# Patient Record
Sex: Female | Born: 1960 | Race: White | Hispanic: No | Marital: Married | Smoking: Former smoker
Health system: Southern US, Community
[De-identification: ages and names within clinical notes are randomized; demographics above are authoritative.]

## PROBLEM LIST (undated history)

## (undated) DIAGNOSIS — E785 Hyperlipidemia, unspecified: Secondary | ICD-10-CM

## (undated) DIAGNOSIS — I10 Essential (primary) hypertension: Secondary | ICD-10-CM

## (undated) DIAGNOSIS — E119 Type 2 diabetes mellitus without complications: Secondary | ICD-10-CM

## (undated) HISTORY — PX: ABDOMINAL HYSTERECTOMY: SHX81

---

## 2004-05-27 ENCOUNTER — Other Ambulatory Visit: Payer: Self-pay

## 2004-05-28 ENCOUNTER — Inpatient Hospital Stay: Payer: Self-pay | Admitting: Internal Medicine

## 2004-06-11 ENCOUNTER — Emergency Department: Payer: Self-pay | Admitting: Unknown Physician Specialty

## 2004-06-11 ENCOUNTER — Other Ambulatory Visit: Payer: Self-pay

## 2004-07-30 ENCOUNTER — Emergency Department: Payer: Self-pay | Admitting: Emergency Medicine

## 2004-07-30 ENCOUNTER — Other Ambulatory Visit: Payer: Self-pay

## 2005-01-29 ENCOUNTER — Emergency Department: Payer: Self-pay | Admitting: Emergency Medicine

## 2005-01-29 ENCOUNTER — Other Ambulatory Visit: Payer: Self-pay

## 2005-06-13 ENCOUNTER — Emergency Department: Payer: Self-pay | Admitting: Emergency Medicine

## 2006-06-18 ENCOUNTER — Emergency Department: Payer: Self-pay | Admitting: Emergency Medicine

## 2007-09-20 ENCOUNTER — Emergency Department: Payer: Self-pay | Admitting: Emergency Medicine

## 2014-02-09 ENCOUNTER — Ambulatory Visit: Payer: Self-pay | Admitting: Internal Medicine

## 2014-04-19 ENCOUNTER — Ambulatory Visit: Payer: Self-pay | Admitting: Internal Medicine

## 2015-08-12 ENCOUNTER — Encounter: Payer: Self-pay | Admitting: *Deleted

## 2015-08-12 ENCOUNTER — Ambulatory Visit
Admission: RE | Admit: 2015-08-12 | Discharge: 2015-08-12 | Disposition: A | Discharge status: Home / Self Care | Payer: Self-pay | Source: Ambulatory Visit | Attending: Internal Medicine | Admitting: Internal Medicine

## 2015-08-12 ENCOUNTER — Encounter (INDEPENDENT_AMBULATORY_CARE_PROVIDER_SITE_OTHER): Payer: Self-pay

## 2015-08-12 ENCOUNTER — Ambulatory Visit: Payer: Self-pay | Attending: Internal Medicine | Admitting: *Deleted

## 2015-08-12 VITALS — BP 142/86 | HR 62 | Temp 98.3°F | Ht 65.75 in | Wt 202.5 lb

## 2015-08-12 DIAGNOSIS — N6452 Nipple discharge: Secondary | ICD-10-CM

## 2015-08-12 NOTE — Patient Instructions (Signed)
Gave patient hand-out, Women Staying Healthy, Active and Well from [facility], with education on breast health, pap smears, heart and colon health. 

## 2015-08-12 NOTE — Progress Notes (Signed)
Subjective:     Patient ID: Tierah Forgacs, female   DOB: 04-Mar-1960, 55 y.o.   MRN: [id number]  HPI   Review of Systems     Objective:   Physical Exam  Pulmonary/Chest: Right breast exhibits no inverted nipple, no mass, no nipple discharge, no skin change and no tenderness. Left breast exhibits no inverted nipple, no mass, no nipple discharge, no skin change and no tenderness. Breasts are symmetrical.       Assessment:     55 year old White female referred to [facility] by Dr. Maryruth Hancock for 10 year history of bilateral intermittent nipple discharge.  Patient states she has been able to express a dark green discharge from bilateral nipples with a significant amount of manipulation.  States there has never been any spontaneous discharge.  States she was told by a physician to stop expressing the discharge.  On clinical breast exam there is no dominant mass, skin changes, nipple discharge or lymphadenopathy.  Encouraged patient to also stop trying to elicit the discharge.  Taught self breast awareness.  Patient has been screened for eligibility.  She does not have any insurance, Medicare or Medicaid.  She also meets financial eligibility.  Hand-out given on the Affordable Care Act.    Plan:     Will get bilateral diagnostic mammogram and ultrasound.  Will follow up per [facility] protocol.

## 2015-08-20 ENCOUNTER — Encounter: Payer: Self-pay | Admitting: *Deleted

## 2015-08-20 NOTE — Progress Notes (Signed)
Called and reviewed mammogram results with patient.  Although the discharge has been present for 10 years and thought to be nonpathologic, I offered to refer for surgical consult for further evaluation.  Patient refused at this time.  Encouraged to call if the nipple discharge becomes spontaneous, becomes bloody or clear.  She was also encouraged to stop manipulating the discharge.  She is agreeable.  She is to return in one year for annual exam.

## 2015-08-23 ENCOUNTER — Other Ambulatory Visit: Payer: Self-pay

## 2017-04-27 ENCOUNTER — Ambulatory Visit: Payer: Self-pay | Admitting: Urology

## 2017-04-29 ENCOUNTER — Other Ambulatory Visit: Payer: Self-pay | Admitting: Urology

## 2017-04-29 DIAGNOSIS — R310 Gross hematuria: Secondary | ICD-10-CM

## 2017-04-30 ENCOUNTER — Emergency Department: Payer: Commercial Managed Care - PPO

## 2017-04-30 ENCOUNTER — Other Ambulatory Visit: Payer: Self-pay

## 2017-04-30 ENCOUNTER — Emergency Department
Admission: EM | Admit: 2017-04-30 | Discharge: 2017-04-30 | Disposition: A | Discharge status: Home / Self Care | Payer: Commercial Managed Care - PPO | Attending: Emergency Medicine | Admitting: Emergency Medicine

## 2017-04-30 ENCOUNTER — Encounter: Payer: Self-pay | Admitting: Emergency Medicine

## 2017-04-30 DIAGNOSIS — M6281 Muscle weakness (generalized): Secondary | ICD-10-CM | POA: Insufficient documentation

## 2017-04-30 DIAGNOSIS — M545 Low back pain, unspecified: Secondary | ICD-10-CM

## 2017-04-30 DIAGNOSIS — Z87891 Personal history of nicotine dependence: Secondary | ICD-10-CM | POA: Insufficient documentation

## 2017-04-30 DIAGNOSIS — E119 Type 2 diabetes mellitus without complications: Secondary | ICD-10-CM | POA: Insufficient documentation

## 2017-04-30 DIAGNOSIS — I10 Essential (primary) hypertension: Secondary | ICD-10-CM | POA: Diagnosis not present

## 2017-04-30 DIAGNOSIS — R1030 Lower abdominal pain, unspecified: Secondary | ICD-10-CM | POA: Diagnosis not present

## 2017-04-30 DIAGNOSIS — R531 Weakness: Secondary | ICD-10-CM

## 2017-04-30 HISTORY — DX: Type 2 diabetes mellitus without complications: E11.9

## 2017-04-30 HISTORY — DX: Essential (primary) hypertension: I10

## 2017-04-30 HISTORY — DX: Hyperlipidemia, unspecified: E78.5

## 2017-04-30 LAB — URINALYSIS, COMPLETE (UACMP) WITH MICROSCOPIC
Bilirubin Urine: NEGATIVE
Glucose, UA: NEGATIVE mg/dL
Hgb urine dipstick: NEGATIVE
Ketones, ur: NEGATIVE mg/dL
Leukocytes, UA: NEGATIVE
Nitrite: NEGATIVE
Protein, ur: NEGATIVE mg/dL
Specific Gravity, Urine: 1.014 (ref 1.005–1.030)
pH: 5 (ref 5.0–8.0)

## 2017-04-30 LAB — BASIC METABOLIC PANEL
ANION GAP: 11 (ref 5–15)
BUN: 17 mg/dL (ref 6–20)
CHLORIDE: 99 mmol/L — AB (ref 101–111)
CO2: 27 mmol/L (ref 22–32)
CREATININE: 0.68 mg/dL (ref 0.44–1.00)
Calcium: 9.2 mg/dL (ref 8.9–10.3)
GFR calc non Af Amer: >60 mL/min (ref >60)
Glucose, Bld: 166 mg/dL — ABNORMAL HIGH (ref 65–99)
POTASSIUM: 3.8 mmol/L (ref 3.5–5.1)
SODIUM: 137 mmol/L (ref 135–145)

## 2017-04-30 LAB — CBC
HCT: 41.5 % (ref 35.0–47.0)
Hemoglobin: 14.5 g/dL (ref 12.0–16.0)
MCH: 32.8 pg (ref 26.0–34.0)
MCHC: 34.9 g/dL (ref 32.0–36.0)
MCV: 94 fL (ref 80.0–100.0)
Platelets: 237 10*3/uL (ref 150–440)
RBC: 4.42 MIL/uL (ref 3.80–5.20)
RDW: 12.5 % (ref 11.5–14.5)
WBC: 7.9 10*3/uL (ref 3.6–11.0)

## 2017-04-30 LAB — TSH: TSH: 1.129 u[IU]/mL (ref 0.350–4.500)

## 2017-04-30 NOTE — ED Triage Notes (Signed)
Pt to ED via POV c/o back pain. Pt states that she was tested for UTI and it was negative. Pt states that she is scheduled for a CT on Tuesday. Pt states that this morning she noticed that she is feeling lethargic and weak. Pt states that she "feels like something is wrong". Pt is in NAD.

## 2017-04-30 NOTE — ED Provider Notes (Signed)
[facility] Emergency Department Provider Note ____________________________________________   First MD Initiated Contact with Patient 04/30/17 1420     (approximate)  I have reviewed the triage vital signs and the nursing notes.   HISTORY  Chief Complaint Back Pain and Weakness    HPI Vilma PraderJanice Bellomo is a 57 y.o. female with PMH as noted below who presents with back pain for the last few weeks, gradual onset, bilateral, mainly in the lower back.  She also reports generalized weakness and feels like she falls asleep easily, which has been particularly in the last few days.  The patient states initially a few weeks ago she developed hematuria, followed up with a urologist few days ago, and is scheduled for some outpatient testing.  She is supposed to have a CT of her abdomen in the next several days to look at her kidneys, and likely a cystoscopy next week.   The patient states that the hematuria has resolved.  She denies fever, vomiting, change in bowel movements, or abdominal pain.   Past Medical History:  Diagnosis Date  . Diabetes mellitus without complication (HCC)   . Hyperlipemia   . Hypertension     There are no active problems to display for this patient.   Past Surgical History:  Procedure Laterality Date  . ABDOMINAL HYSTERECTOMY      Prior to Admission medications   Not on File    Allergies Codeine; Contrast media [iodinated diagnostic agents]; Morphine and related; and Septra [sulfamethoxazole-trimethoprim]  Family History  Problem Relation Age of Onset  . Breast cancer Neg Hx     Social History Social History   Tobacco Use  . Smoking status: Former Games developermoker  . Smokeless tobacco: Never Used  Substance Use Topics  . Alcohol use: Yes    Comment: Occ  . Drug use: Not Currently    Review of Systems  Constitutional: No fever/chills Eyes: No visual changes. ENT: No sore throat. Cardiovascular: Denies chest  pain. Respiratory: Denies shortness of breath. Gastrointestinal: No vomiting or diarrhea.  Genitourinary: Negative for dysuria or hematuria.  Musculoskeletal: Positive for back pain. Skin: Negative for rash. Neurological: Negative for headache.   ____________________________________________   PHYSICAL EXAM:  VITAL SIGNS: ED Triage Vitals  Enc Vitals Group     BP 04/30/17 1109 (!) 165/85     Pulse Rate 04/30/17 1109 79     Resp 04/30/17 1109 16     Temp 04/30/17 1109 99 F (37.2 C)     Temp Source 04/30/17 1109 Oral     SpO2 04/30/17 1109 99 %     Weight 04/30/17 1110 210 lb (95.3 kg)     Height 04/30/17 1110 5\' 3"  (1.6 m)     Head Circumference --      Peak Flow --      Pain Score 04/30/17 1117 5     Pain Loc --      Pain Edu? --      Excl. in GC? --     Constitutional: Alert and oriented. Well appearing and in no acute distress. Eyes: Conjunctivae are normal.  EOMI.  PERRLA. Head: Atraumatic. Nose: No congestion/rhinnorhea. Mouth/Throat: Mucous membranes are moist.   Neck: Normal range of motion.  Cardiovascular: Good peripheral circulation. Respiratory: Normal respiratory effort.  Gastrointestinal: Soft and nontender. No distention.  Genitourinary: No CVA tenderness. Musculoskeletal: No lower extremity edema.  Extremities warm and well perfused.  No midline or paraspinal lower back tenderness. Neurologic:  Normal speech  and language. No gross focal neurologic deficits are appreciated.  Skin:  Skin is warm and dry. No rash noted. Psychiatric: Mood and affect are normal. Speech and behavior are normal.  ____________________________________________   LABS (all labs ordered are listed, but only abnormal results are displayed)  Labs Reviewed  BASIC METABOLIC PANEL - Abnormal; Notable for the following components:      Result Value   Chloride 99 (*)    Glucose, Bld 166 (*)    All other components within normal limits  URINALYSIS, COMPLETE (UACMP) WITH  MICROSCOPIC - Abnormal; Notable for the following components:   Color, Urine YELLOW (*)    APPearance CLEAR (*)    Squamous Epithelial / LPF 0-5 (*)    All other components within normal limits  CBC  TSH  CBG MONITORING, ED   ____________________________________________  EKG  ED ECG REPORT I, Dionne Bucy, the attending physician, personally viewed and interpreted this ECG.  Date: 04/30/2017 EKG Time: 1123 Rate: 71 Rhythm: normal sinus rhythm QRS Axis: normal Intervals: normal ST/T Wave abnormalities: normal Narrative Interpretation: no evidence of acute ischemia  ____________________________________________  RADIOLOGY  CT abdomen: pending  ____________________________________________   PROCEDURES  Procedure(s) performed: No  Procedures  Critical Care performed: No ____________________________________________   INITIAL IMPRESSION / ASSESSMENT AND PLAN / ED COURSE  Pertinent labs & imaging results that were available during my care of the patient were reviewed by me and considered in my medical decision making (see chart for details).  57 year old female with past medical history as noted above presents with back pain over the last few weeks, resolved hematuria, and generalized weakness and malaise.  On exam, the patient is well-appearing, vital signs are normal except for hypertension, and the remainder of the exam is as described above.  There is no back or abdominal tenderness on exam.  Overall the differential is somewhat broad.  It is possible that the patient could be having ureteral stones although this is less likely given that the pain is bilateral.  She has no specific symptoms to suggest malignancy.  Her UA is not consistent with UTI, she also had a negative UA at the urologist several days ago.  In terms of generalized weakness, differential includes dehydration, electrolyte abnormality, anemia, infection, or possibly thyroid  dysfunction.  Plan: I will obtain the CT abdomen (without contrast) that had been already ordered as an outpatient but not yet completed, basic labs, TSH, and reassess.  Anticipate discharge home if no acute findings on the work-up.    I am signing out the patient to the oncoming physician Dr. Roxan Hockey pending TSH and CT result.  ____________________________________________   FINAL CLINICAL IMPRESSION(S) / ED DIAGNOSES  Final diagnoses:  Low back pain without sciatica, unspecified back pain laterality, unspecified chronicity  Generalized weakness      NEW MEDICATIONS STARTED DURING THIS VISIT:  New Prescriptions   No medications on file     Note:  This document was prepared using Dragon voice recognition software and may include unintentional dictation errors.    Dionne Bucy, MD 04/30/17 [phone]

## 2017-04-30 NOTE — ED Provider Notes (Signed)
Patient received in sign-out from Dr. Marisa SeverinSiadecki.  Workup and evaluation pending CT and tsh.  He shows evidence of probable renal scarring will require dedicated CT with IV contrast ultrasound with does not appear to require this on an emergent basis.  TSH within normal limits.  Patient stable and appropriate for outpatient follow-up.       Deborah Velasquez, Deborah Born, MD 04/30/17 1556

## 2017-05-04 ENCOUNTER — Ambulatory Visit: Payer: Commercial Managed Care - PPO

## 2017-05-10 ENCOUNTER — Other Ambulatory Visit: Payer: Self-pay | Admitting: Urology

## 2017-05-10 DIAGNOSIS — R310 Gross hematuria: Secondary | ICD-10-CM

## 2017-05-12 ENCOUNTER — Ambulatory Visit
Admission: RE | Admit: 2017-05-12 | Discharge: 2017-05-12 | Disposition: A | Discharge status: Home / Self Care | Payer: Commercial Managed Care - PPO | Source: Ambulatory Visit | Attending: Urology | Admitting: Urology

## 2017-05-12 DIAGNOSIS — R310 Gross hematuria: Secondary | ICD-10-CM | POA: Diagnosis not present

## 2017-05-17 ENCOUNTER — Other Ambulatory Visit: Payer: Self-pay | Admitting: Urology

## 2017-05-17 DIAGNOSIS — N2889 Other specified disorders of kidney and ureter: Secondary | ICD-10-CM

## 2017-05-19 ENCOUNTER — Ambulatory Visit: Payer: Commercial Managed Care - PPO

## 2017-05-21 ENCOUNTER — Ambulatory Visit: Payer: Self-pay

## 2017-05-31 ENCOUNTER — Ambulatory Visit: Payer: Commercial Managed Care - PPO

## 2017-06-10 ENCOUNTER — Encounter (INDEPENDENT_AMBULATORY_CARE_PROVIDER_SITE_OTHER): Payer: Self-pay

## 2017-06-10 ENCOUNTER — Ambulatory Visit
Admission: RE | Admit: 2017-06-10 | Discharge: 2017-06-10 | Disposition: A | Discharge status: Home / Self Care | Payer: Commercial Managed Care - PPO | Source: Ambulatory Visit | Attending: Urology | Admitting: Urology

## 2017-06-10 DIAGNOSIS — N281 Cyst of kidney, acquired: Secondary | ICD-10-CM | POA: Insufficient documentation

## 2017-06-10 DIAGNOSIS — I700 Atherosclerosis of aorta: Secondary | ICD-10-CM | POA: Diagnosis not present

## 2017-06-10 DIAGNOSIS — N2889 Other specified disorders of kidney and ureter: Secondary | ICD-10-CM

## 2017-06-10 DIAGNOSIS — K449 Diaphragmatic hernia without obstruction or gangrene: Secondary | ICD-10-CM | POA: Diagnosis not present

## 2017-06-10 DIAGNOSIS — K760 Fatty (change of) liver, not elsewhere classified: Secondary | ICD-10-CM | POA: Insufficient documentation

## 2017-06-10 DIAGNOSIS — K8689 Other specified diseases of pancreas: Secondary | ICD-10-CM | POA: Insufficient documentation

## 2017-06-10 MED ORDER — GADOBENATE DIMEGLUMINE 529 MG/ML IV SOLN
20.0000 mL | Freq: Once | INTRAVENOUS | Status: AC | PRN
  Administered 2017-06-10: 20 mL via INTRAVENOUS

## 2017-09-06 ENCOUNTER — Other Ambulatory Visit: Payer: Self-pay | Admitting: Family Medicine

## 2017-09-06 DIAGNOSIS — Z1231 Encounter for screening mammogram for malignant neoplasm of breast: Secondary | ICD-10-CM

## 2018-09-22 ENCOUNTER — Other Ambulatory Visit: Payer: Self-pay

## 2018-09-22 DIAGNOSIS — Z20822 Contact with and (suspected) exposure to covid-19: Secondary | ICD-10-CM

## 2018-09-23 ENCOUNTER — Telehealth: Payer: Self-pay

## 2018-09-23 LAB — NOVEL CORONAVIRUS, NAA: SARS-CoV-2, NAA: NOT DETECTED

## 2018-09-23 NOTE — Telephone Encounter (Signed)
This encounter was created in error - please disregard.

## 2018-09-23 NOTE — Telephone Encounter (Signed)
Provided Covid lab testing results.   Voiced understanding.

## 2018-09-29 ENCOUNTER — Other Ambulatory Visit: Payer: Self-pay

## 2018-09-29 DIAGNOSIS — Z20822 Contact with and (suspected) exposure to covid-19: Secondary | ICD-10-CM

## 2018-09-30 LAB — NOVEL CORONAVIRUS, NAA: SARS-CoV-2, NAA: NOT DETECTED

## 2018-10-03 ENCOUNTER — Telehealth: Payer: Self-pay | Admitting: Family Medicine

## 2018-10-03 NOTE — Telephone Encounter (Signed)
Patient is calling to receive her negative COVID test results. Patient expressed understanding. 

## 2018-10-03 NOTE — Telephone Encounter (Signed)
Pt states she asked for her covid results from 9/10 be mailed to her home, but she never received. Pt requesting resutls from 9/10 and 9/17 be faxed to her home. Fax number: [phone]  Pt cannot return to work until she receives something in writing.  Pt has mychart, but only has iphone, no computer to print from.

## 2018-10-20 ENCOUNTER — Other Ambulatory Visit: Payer: Self-pay

## 2018-10-20 DIAGNOSIS — Z20822 Contact with and (suspected) exposure to covid-19: Secondary | ICD-10-CM

## 2018-10-21 LAB — NOVEL CORONAVIRUS, NAA: SARS-CoV-2, NAA: NOT DETECTED

## 2018-10-27 ENCOUNTER — Other Ambulatory Visit: Payer: Self-pay

## 2018-10-27 DIAGNOSIS — Z20822 Contact with and (suspected) exposure to covid-19: Secondary | ICD-10-CM

## 2018-10-29 LAB — NOVEL CORONAVIRUS, NAA: SARS-CoV-2, NAA: NOT DETECTED

## 2019-03-27 ENCOUNTER — Other Ambulatory Visit: Payer: Commercial Managed Care - PPO

## 2019-06-16 ENCOUNTER — Ambulatory Visit: Payer: Self-pay | Admitting: Podiatry

## 2019-07-05 ENCOUNTER — Encounter: Payer: Self-pay | Admitting: Podiatry

## 2019-07-05 ENCOUNTER — Ambulatory Visit (INDEPENDENT_AMBULATORY_CARE_PROVIDER_SITE_OTHER): Payer: Commercial Managed Care - PPO | Admitting: Podiatry

## 2019-07-05 ENCOUNTER — Ambulatory Visit (INDEPENDENT_AMBULATORY_CARE_PROVIDER_SITE_OTHER): Payer: Commercial Managed Care - PPO

## 2019-07-05 ENCOUNTER — Other Ambulatory Visit: Payer: Self-pay

## 2019-07-05 DIAGNOSIS — I10 Essential (primary) hypertension: Secondary | ICD-10-CM | POA: Insufficient documentation

## 2019-07-05 DIAGNOSIS — M722 Plantar fascial fibromatosis: Secondary | ICD-10-CM

## 2019-07-05 DIAGNOSIS — E785 Hyperlipidemia, unspecified: Secondary | ICD-10-CM | POA: Insufficient documentation

## 2019-07-05 DIAGNOSIS — K219 Gastro-esophageal reflux disease without esophagitis: Secondary | ICD-10-CM | POA: Insufficient documentation

## 2019-07-05 DIAGNOSIS — E119 Type 2 diabetes mellitus without complications: Secondary | ICD-10-CM | POA: Insufficient documentation

## 2019-07-05 MED ORDER — METHYLPREDNISOLONE 4 MG PO TBPK: ORAL_TABLET | ORAL | 0 refills | Status: DC

## 2019-07-05 NOTE — Progress Notes (Signed)
Subjective:  Patient ID: Deborah Velasquez, female    DOB: 1960/06/02,  MRN: [id number] HPI Chief Complaint  Patient presents with  . 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 New Patient (Initial Visit)    59 y.o. female presents with the above complaint.   ROS: Denies fever chills nausea vomiting muscle aches pains calf pain back pain chest pain shortness of breath.  Past Medical History:  Diagnosis Date  . Diabetes mellitus without complication ([place])   . Hyperlipemia   . Hypertension    Past Surgical History:  Procedure Laterality Date  . ABDOMINAL HYSTERECTOMY      Current Outpatient Medications:  .  aspirin EC 81 MG tablet, Take 81 mg by mouth daily. Swallow whole., Disp: , Rfl:  .  famotidine (PEPCID) 10 MG tablet, Take 10 mg by mouth 2 (two) times daily., Disp: , Rfl:  .  atorvastatin (LIPITOR) 40 MG tablet, SMARTSIG:1 Tablet(s) By Mouth Every Evening, Disp: , Rfl:  .  glipiZIDE (GLUCOTROL XL) 5 MG 24 hr tablet, Take 5 mg by mouth daily., Disp: , Rfl:  .  lisinopril-hydrochlorothiazide (ZESTORETIC) 20-25 MG tablet, Take 1 tablet by mouth daily., Disp: , Rfl:  .  methylPREDNISolone (MEDROL DOSEPAK) 4 MG TBPK tablet, 6 day dose pack - take as directed, Disp: 21 tablet, Rfl: 0 .  metoprolol succinate (TOPROL-XL) 50 MG 24 hr tablet, Take 50 mg by mouth daily., Disp: , Rfl:   Allergies  Allergen Reactions  . Codeine Itching and Nausea Only  . Contrast Media [Iodinated Diagnostic Agents] Itching and Nausea Only  . Morphine And Related Itching and Nausea Only  . Septra [Sulfamethoxazole-Trimethoprim] Itching and Nausea Only   Review of Systems Objective:  There were no vitals filed for this visit.  General: Well developed, nourished, in no acute distress, alert and oriented x3   Dermatological: Skin is warm, dry and supple bilateral.  Nails x 10 are well maintained; remaining integument appears unremarkable at this time. There are no open sores, no preulcerative lesions, no rash or signs of infection present.  Vascular: Dorsalis Pedis artery and Posterior Tibial artery pedal pulses are 2/4 bilateral with immedate capillary fill time. Pedal hair growth present. No varicosities and no lower extremity edema present bilateral.   Neruologic: Grossly intact via light touch bilateral. Vibratory intact via tuning fork bilateral. Protective threshold with Semmes Wienstein monofilament intact to all pedal sites bilateral. Patellar and Achilles deep tendon reflexes 2+ bilateral. No Babinski or clonus noted bilateral.   Musculoskeletal: No gross boney pedal deformities bilateral. No pain, crepitus, or limitation noted with foot and ankle range of motion bilateral. Muscular strength 5/5 in all groups tested bilateral.  She has pain on palpation medial calcaneal tubercles bilaterally.  Gait: Unassisted, Nonantalgic.    Radiographs:  Radiographs taken today bilateral foot demonstrate soft tissue increase in density plantar past cocaine insertion site consistent with plantar fasciitis no significant spurring is noted.  Assessment & Plan:   Assessment: Plantar fasciitis bilateral.    Plan: Injected the bilateral heels today 20 mg Kenalog 5 mg Marcaine point maximal tenderness.  Tolerated procedure well.  Placement plan pressure brace and a night splint discussed appropriate shoe gear stretching exercises ice therapy and shoe modifications.  Start her on a Medrol Dosepak.  Did not give her any NSAIDs because of  her GI tract.  I will follow up with her in 1 month     Aditri Louischarles T. Sad[place], [place]

## 2019-07-27 DIAGNOSIS — M79676 Pain in unspecified toe(s): Secondary | ICD-10-CM

## 2019-08-09 ENCOUNTER — Other Ambulatory Visit: Payer: Self-pay

## 2019-08-09 ENCOUNTER — Encounter: Payer: Self-pay | Admitting: Podiatry

## 2019-08-09 ENCOUNTER — Ambulatory Visit (INDEPENDENT_AMBULATORY_CARE_PROVIDER_SITE_OTHER): Payer: Commercial Managed Care - PPO | Admitting: Podiatry

## 2019-08-09 DIAGNOSIS — M722 Plantar fascial fibromatosis: Secondary | ICD-10-CM | POA: Diagnosis not present

## 2019-08-09 MED ORDER — METHYLPREDNISOLONE 4 MG PO TBPK: ORAL_TABLET | ORAL | 0 refills | Status: AC

## 2019-08-09 NOTE — Progress Notes (Signed)
She presents today for follow-up of her plantar fasciitis. States that her left foot is doing great and the right foot is still hurting and is even starting to hurt up into the arch and toward the ball of the foot.  Objective: Vital signs are stable she is alert and oriented x3. Pulses are palpable. She still has some tenderness on palpation medial calcaneal tubercle of the right heel none on the left foot.  Assessment: Left foot is 95% improved right foot some improved but still hurting.  Plan: I injected the right heel today 20 mg of Kenalog 5 mg Marcaine point maximal tenderness. Reinitiate a Medrol Dosepak and discussed appropriate shoe gear once again. On follow-up with her in 1 month to see how she is doing we probably will end up having to get her some orthotics made.

## 2019-09-13 ENCOUNTER — Ambulatory Visit: Payer: Commercial Managed Care - PPO | Admitting: Podiatry

## 2019-10-18 ENCOUNTER — Ambulatory Visit: Payer: Commercial Managed Care - PPO | Admitting: Podiatry

## 2019-11-06 ENCOUNTER — Other Ambulatory Visit: Payer: Self-pay | Admitting: Family Medicine

## 2019-11-06 DIAGNOSIS — Z1231 Encounter for screening mammogram for malignant neoplasm of breast: Secondary | ICD-10-CM

## 2020-01-13 IMAGING — US US RENAL
1 series · 14 of 25 positions shown · non-contrast
Comparison: None; reported abnormal CT is not available for
comparison

CLINICAL DATA: Gross hematuria, diabetes mellitus, hypertension,
abnormal CT exam

EXAM:
RENAL / URINARY TRACT ULTRASOUND COMPLETE

[Series 1: us renal · 0.25mm/px · 14 of 65 slices shown]
[im 1/65]
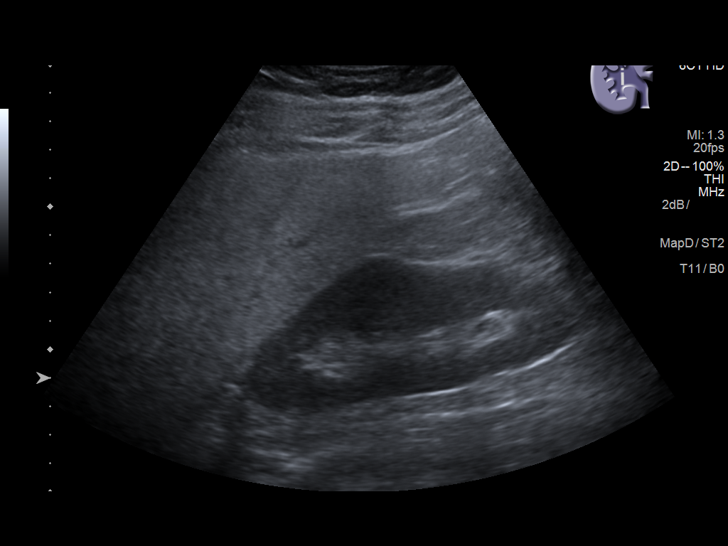
[im 6/65]
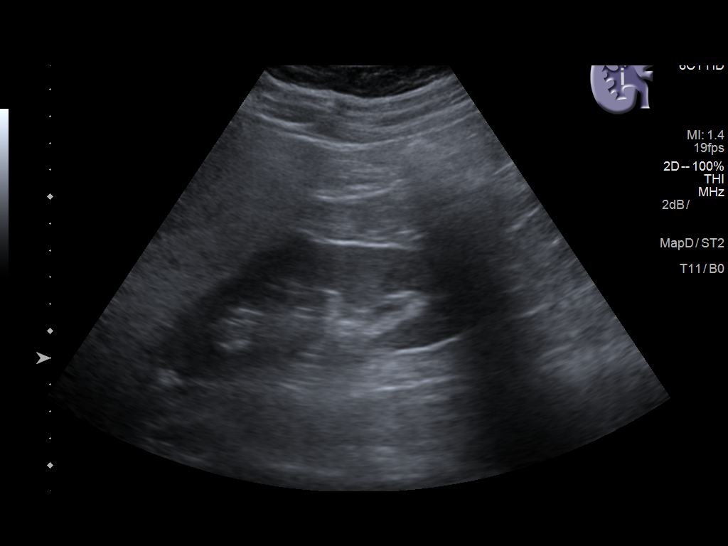
[im 11/65]
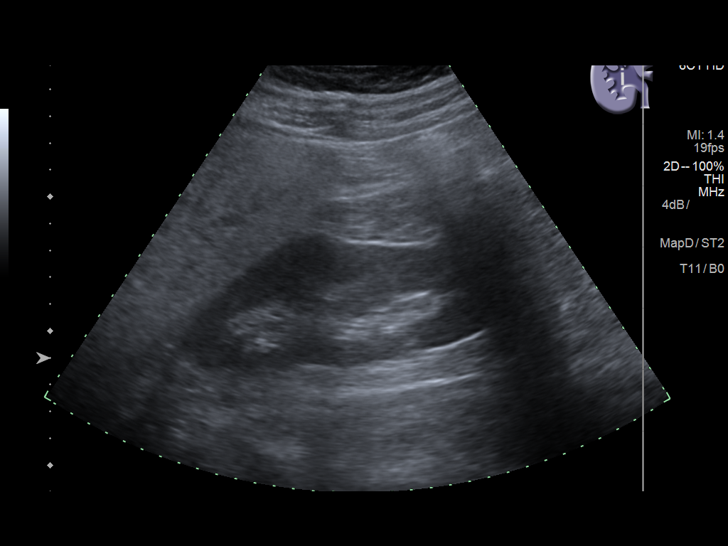
[im 17/65]
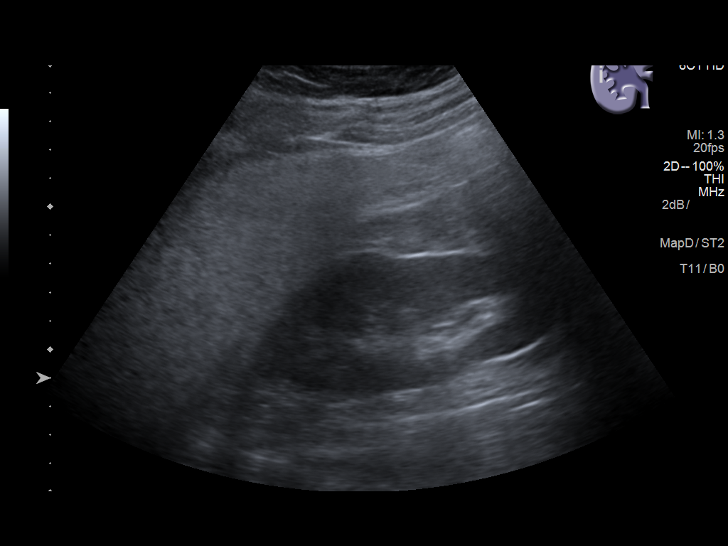
[im 22/65]
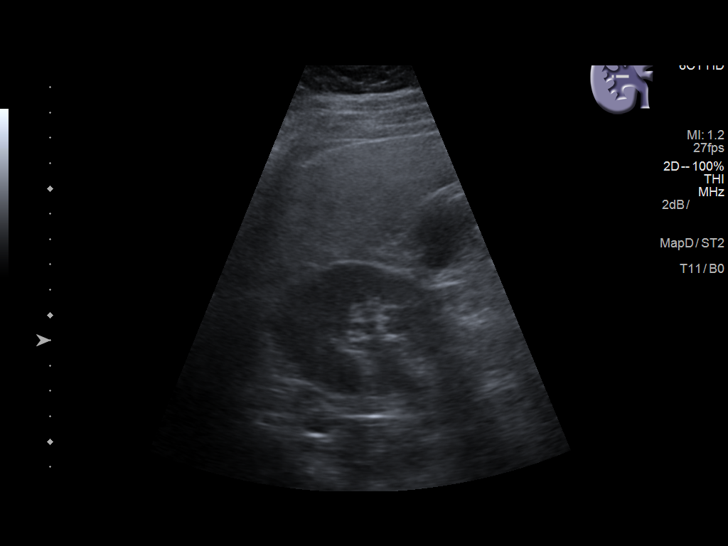
[im 25/65]
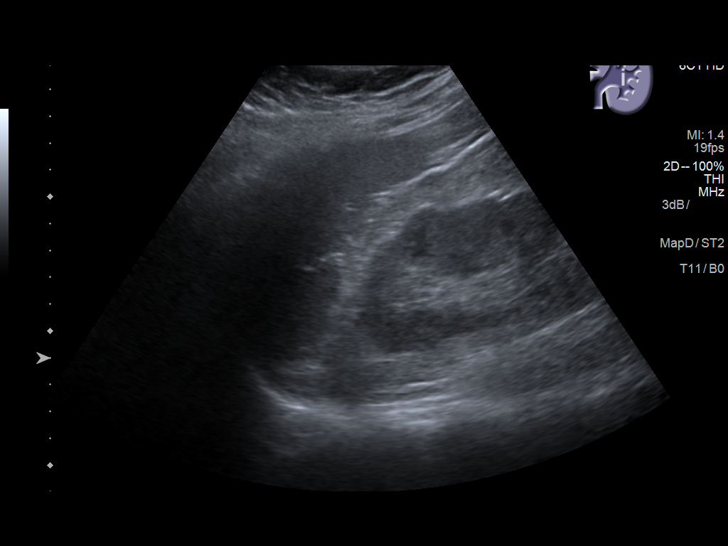
[im 30/65]
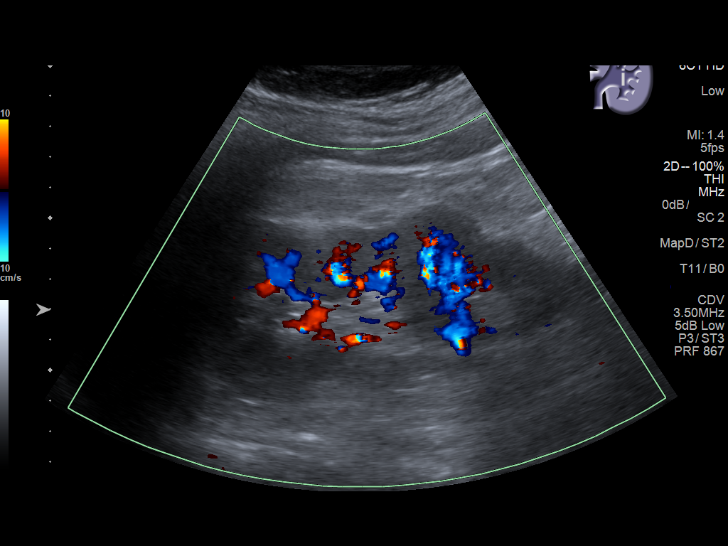
[im 35/65]
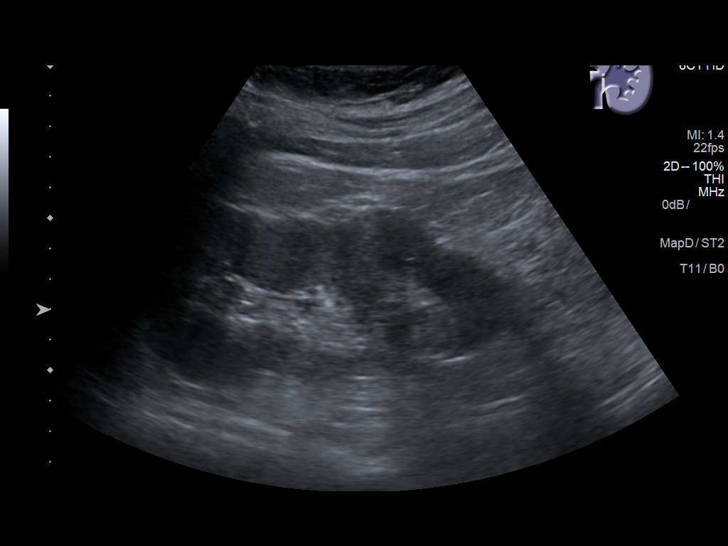
[im 41/65]
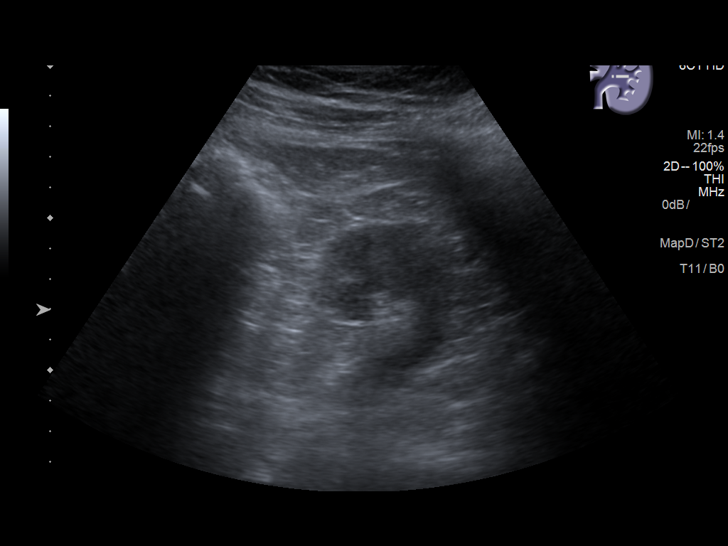
[im 43/65]
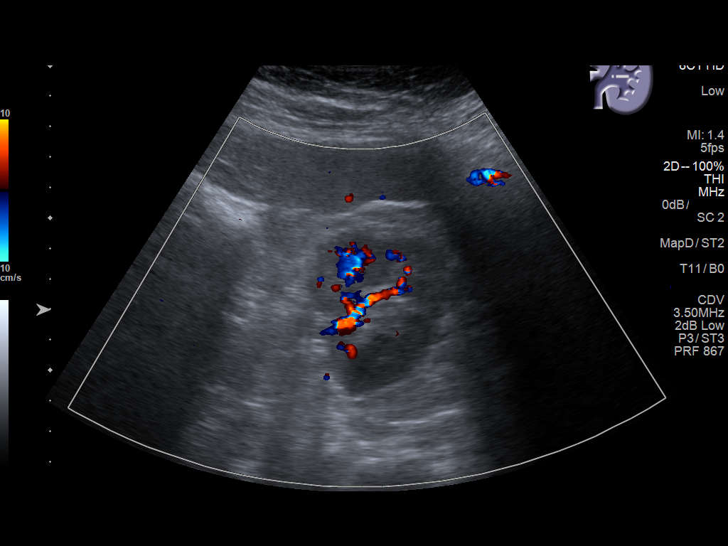
[im 49/65]
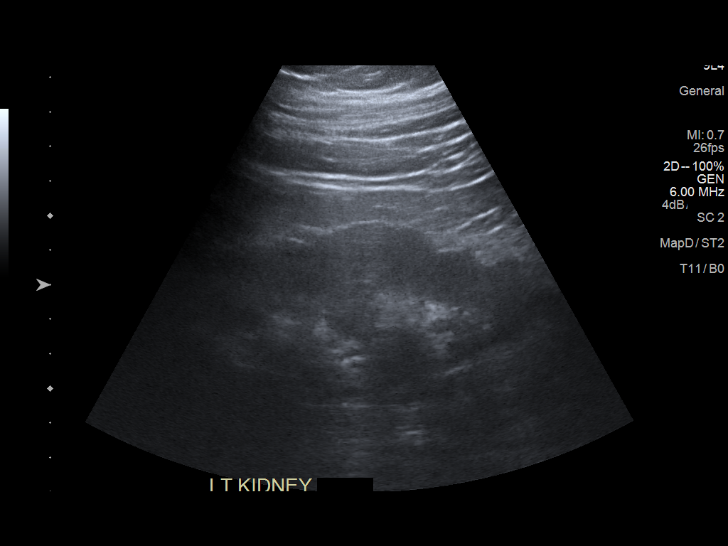
[im 54/65]
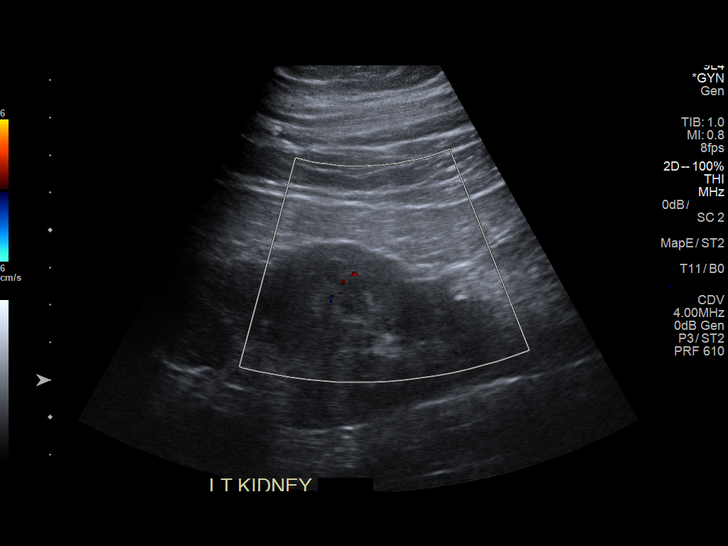
[im 59/65]
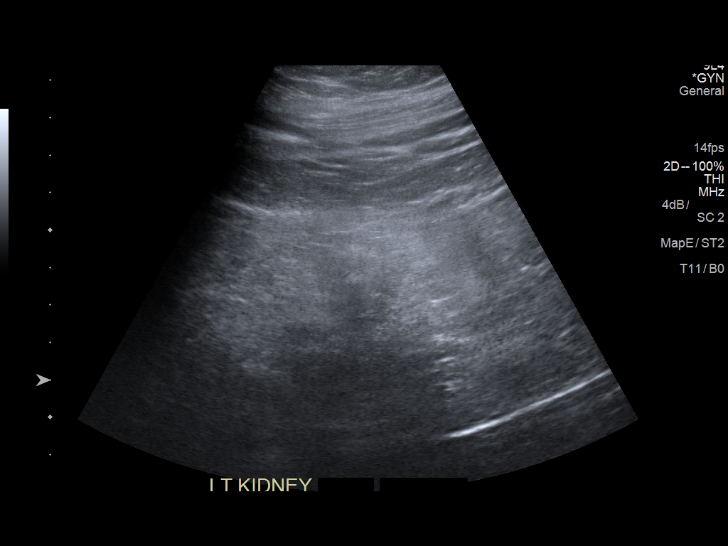
[im 65/65]
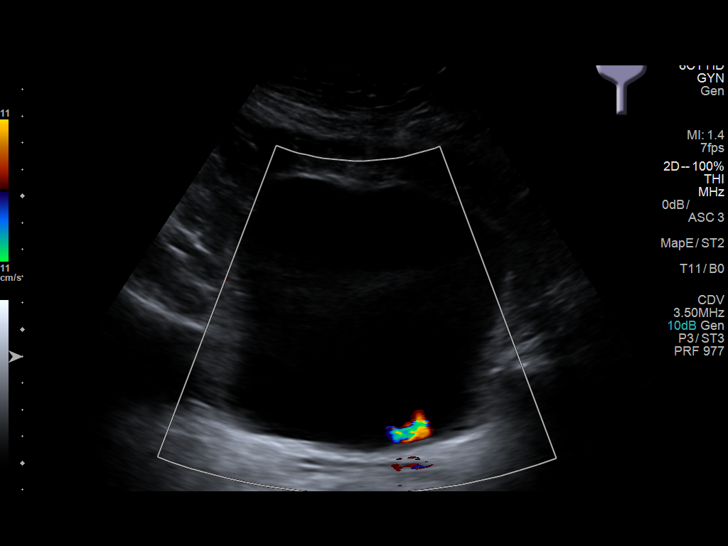

[14 of 25 positions shown; findings below may reference images not displayed]

FINDINGS: Right Kidney:

Length: 12.7 cm. Normal cortical thickness and echogenicity. No
mass, hydronephrosis or shadowing calcification.

Left Kidney:

Length: 12.4 cm. Lobulated LEFT renal contour, especially prominent
at the lower pole region with a questionable area of cortical
thickening. Normal cortical thickness and echogenicity otherwise
seen. No additional mass lesions. No hydronephrosis or shadowing
calcification.

Bladder:

Appears normal for degree of bladder distention. BILATERAL ureteral
jets visualized.
IMPRESSION: Normal sonographic appearance of the RIGHT kidney.

Lobulated LEFT renal contour especially at the lower pole region
with a questionable area of cortical thickening; however, a discrete
mass is not definitely delineated.

Recommend follow-up MR imaging of the kidneys with and without
contrast to exclude subtle mass lesion at the inferior pole of the
LEFT kidney.

## 2020-12-18 ENCOUNTER — Ambulatory Visit: Payer: 59 | Admitting: Podiatry

## 2020-12-23 ENCOUNTER — Ambulatory Visit: Payer: 59 | Admitting: Podiatry

## 2020-12-23 ENCOUNTER — Other Ambulatory Visit: Payer: Self-pay

## 2020-12-23 ENCOUNTER — Ambulatory Visit (INDEPENDENT_AMBULATORY_CARE_PROVIDER_SITE_OTHER): Payer: 59

## 2020-12-23 DIAGNOSIS — M722 Plantar fascial fibromatosis: Secondary | ICD-10-CM | POA: Diagnosis not present

## 2020-12-23 NOTE — Progress Notes (Signed)
  Subjective:  Patient ID: Deborah Velasquez, female    DOB: 1960-07-19,  MRN: [id number]  Chief Complaint  Patient presents with   Plantar Fasciitis    Left foot problems with planter fasciitis (r/s from 12/07 dr. Al Corpus)    60 y.o. female presents with the above complaint. History confirmed with patient.  This is a recurrent issue for her she has had this previously, and wanting a work for the injections therapy and night splints and brace was not helpful  Objective:  Physical Exam: warm, good capillary refill, no trophic changes or ulcerative lesions, normal DP and PT pulses, and normal sensory exam. Left Foot: point tenderness over the heel pad and point tenderness of the mid plantar fascia   No images are attached to the encounter.  Radiographs: Multiple views x-ray of the left foot: no fracture, dislocation, swelling or degenerative changes noted and plantar calcaneal spur which is increased in size since last visit Assessment:   1. Plantar fasciitis      Plan:  Patient was evaluated and treated and all questions answered.  Discussed the etiology and treatment options for plantar fasciitis including stretching, formal physical therapy, supportive shoegears such as a running shoe or sneaker, pre fabricated orthoses, injection therapy, and oral medications. We also discussed the role of surgical treatment of this for patients who do not improve after exhausting non-surgical treatment options.   -XR reviewed with patient -Injection delivered to the plantar fascia of the left foot.  After sterile prep with povidone-iodine solution and alcohol, the left heel was injected with 0.5cc 2% xylocaine plain, 0.5cc 0.5% marcaine plain, 5mg  triamcinolone acetonide, and 2mg  dexamethasone was injected along the medial plantar fascia at the insertion on the plantar calcaneus. The patient tolerated the procedure well without complication.  Return in about 6 weeks (around 02/03/2021) for recheck  plantar fasciitis.

## 2021-02-03 ENCOUNTER — Other Ambulatory Visit: Payer: Self-pay

## 2021-02-03 ENCOUNTER — Ambulatory Visit: Payer: Self-pay | Admitting: Podiatry

## 2021-03-03 ENCOUNTER — Ambulatory Visit: Payer: 59 | Admitting: Podiatry

## 2021-03-03 ENCOUNTER — Encounter: Payer: Self-pay | Admitting: Podiatry

## 2021-03-03 ENCOUNTER — Other Ambulatory Visit: Payer: Self-pay

## 2021-03-03 DIAGNOSIS — M722 Plantar fascial fibromatosis: Secondary | ICD-10-CM | POA: Diagnosis not present

## 2021-03-03 NOTE — Progress Notes (Signed)
°  Subjective:  Patient ID: Deborah Velasquez, female    DOB: Aug 09, 1960,  MRN: [id number]  Chief Complaint  Patient presents with   Plantar Fasciitis    Another flare up left heel which started 3 weeks ago   She reports the last injection really helped and is requesting another injection    61 y.o. female presents with the above complaint. History confirmed with patient.  Pain started to return about 3 weeks ago was not as intense as it was about a 6 out of 10 at this point.  Objective:  Physical Exam: warm, good capillary refill, no trophic changes or ulcerative lesions, normal DP and PT pulses, and normal sensory exam. Left Foot: point tenderness over the heel pad and point tenderness of the mid plantar fascia   No images are attached to the encounter.  Radiographs: Multiple views x-ray of the left foot: no fracture, dislocation, swelling or degenerative changes noted and plantar calcaneal spur which is increased in size since last visit Assessment:   1. Plantar fasciitis      Plan:  Patient was evaluated and treated and all questions answered.  Discussed the etiology and treatment options for plantar fasciitis including stretching, formal physical therapy, supportive shoegears such as a running shoe or sneaker, pre fabricated orthoses, injection therapy, and oral medications. We also discussed the role of surgical treatment of this for patients who do not improve after exhausting non-surgical treatment options.   -XR reviewed with patient -Injection delivered to the plantar fascia of the left foot.  After sterile prep with povidone-iodine solution and alcohol, the left heel was injected with 0.5cc 2% xylocaine plain, 0.5cc 0.5% marcaine plain, 5mg  triamcinolone acetonide, and 2mg  dexamethasone was injected along the medial plantar fascia at the insertion on the plantar calcaneus. The patient tolerated the procedure well without complication.  Return in about 12 weeks (around  05/26/2021) for recheck plantar fasciitis.

## 2021-03-31 DIAGNOSIS — Z0000 Encounter for general adult medical examination without abnormal findings: Secondary | ICD-10-CM | POA: Diagnosis not present

## 2021-03-31 DIAGNOSIS — R7309 Other abnormal glucose: Secondary | ICD-10-CM | POA: Diagnosis not present

## 2021-03-31 DIAGNOSIS — E119 Type 2 diabetes mellitus without complications: Secondary | ICD-10-CM | POA: Diagnosis not present

## 2021-03-31 DIAGNOSIS — I10 Essential (primary) hypertension: Secondary | ICD-10-CM | POA: Diagnosis not present

## 2021-03-31 DIAGNOSIS — Z0130 Encounter for examination of blood pressure without abnormal findings: Secondary | ICD-10-CM | POA: Diagnosis not present

## 2021-03-31 DIAGNOSIS — Z1389 Encounter for screening for other disorder: Secondary | ICD-10-CM | POA: Diagnosis not present

## 2021-03-31 DIAGNOSIS — Z1211 Encounter for screening for malignant neoplasm of colon: Secondary | ICD-10-CM | POA: Diagnosis not present

## 2021-05-20 DIAGNOSIS — K625 Hemorrhage of anus and rectum: Secondary | ICD-10-CM | POA: Diagnosis not present

## 2021-05-20 DIAGNOSIS — K640 First degree hemorrhoids: Secondary | ICD-10-CM | POA: Diagnosis not present

## 2021-05-20 DIAGNOSIS — D120 Benign neoplasm of cecum: Secondary | ICD-10-CM | POA: Diagnosis not present

## 2021-05-20 DIAGNOSIS — E119 Type 2 diabetes mellitus without complications: Secondary | ICD-10-CM | POA: Diagnosis not present

## 2021-05-20 DIAGNOSIS — Z7984 Long term (current) use of oral hypoglycemic drugs: Secondary | ICD-10-CM | POA: Diagnosis not present

## 2021-05-20 DIAGNOSIS — D122 Benign neoplasm of ascending colon: Secondary | ICD-10-CM | POA: Diagnosis not present

## 2021-05-20 DIAGNOSIS — R69 Illness, unspecified: Secondary | ICD-10-CM | POA: Diagnosis not present

## 2021-05-20 DIAGNOSIS — E785 Hyperlipidemia, unspecified: Secondary | ICD-10-CM | POA: Diagnosis not present

## 2021-05-20 DIAGNOSIS — I10 Essential (primary) hypertension: Secondary | ICD-10-CM | POA: Diagnosis not present

## 2021-05-26 ENCOUNTER — Encounter: Payer: 59 | Admitting: Podiatry

## 2021-06-18 ENCOUNTER — Encounter: Payer: Self-pay | Admitting: Podiatry

## 2021-06-18 ENCOUNTER — Ambulatory Visit: Payer: 59 | Admitting: Podiatry

## 2021-06-18 DIAGNOSIS — M21862 Other specified acquired deformities of left lower leg: Secondary | ICD-10-CM

## 2021-06-18 DIAGNOSIS — M722 Plantar fascial fibromatosis: Secondary | ICD-10-CM

## 2021-06-18 NOTE — Progress Notes (Signed)
  Subjective:  Patient ID: Deborah Velasquez, female    DOB: 18-Dec-1960,  MRN: [id number]  Chief Complaint  Patient presents with   Plantar Fasciitis    "It's killing me."    61 y.o. female presents with the above complaint. History confirmed with patient.  Pain has returned and is very intense she describes it as approximately a 7 or 8 out of 10 now once again  Objective:  Physical Exam: warm, good capillary refill, no trophic changes or ulcerative lesions, normal DP and PT pulses, and normal sensory exam. Left Foot: point tenderness over the heel pad and point tenderness of the mid plantar fascia   No images are attached to the encounter.  Radiographs: Multiple views x-ray of the left foot: no fracture, dislocation, swelling or degenerative changes noted and plantar calcaneal spur which is increased in size since last visit Assessment:   1. Plantar fasciitis   2. Gastrocnemius equinus of left lower extremity      Plan:  Patient was evaluated and treated and all questions answered.  Planter fasciitis has continued to worsen.  We discussed further nonoperative and operative treatment.  I think is probably worth considering operative treatment at this point, however prior to deciding this I would recommend an MRI to evaluate the plantar fascia itself.  I did recommend a repeat cortisone injection which was performed today.  She is also not completed formal physical therapy she is only done her own home physical therapy plan and I sent a referral to [facility] for this for her.  After sterile prep with povidone-iodine solution and alcohol, the left heel was injected with 0.5cc 2% xylocaine plain, 0.5cc 0.5% marcaine plain, 5mg  triamcinolone acetonide, and 2mg  dexamethasone was injected along the medial plantar fascia at the insertion on the plantar calcaneus. The patient tolerated the procedure well without complication.  Return for after MRI to review.

## 2021-06-18 NOTE — Patient Instructions (Addendum)
Call [facility] to schedule your MRI at the below locations.  Please allow at least 1 business day after your visit to process the referral.  It may take longer depending on approval from insurance.  Please let me know if you have issues or problems scheduling the MRI   [facility] [phone] [address] [place] [address] [place], [place] [address]   Call [phone] to schedule PT

## 2021-06-20 DIAGNOSIS — E119 Type 2 diabetes mellitus without complications: Secondary | ICD-10-CM | POA: Diagnosis not present

## 2021-06-20 DIAGNOSIS — I10 Essential (primary) hypertension: Secondary | ICD-10-CM | POA: Diagnosis not present

## 2021-06-25 ENCOUNTER — Ambulatory Visit
Admission: RE | Admit: 2021-06-25 | Discharge: 2021-06-25 | Disposition: A | Discharge status: Home / Self Care | Payer: 59 | Source: Ambulatory Visit | Attending: Podiatry | Admitting: Podiatry

## 2021-06-25 DIAGNOSIS — M722 Plantar fascial fibromatosis: Secondary | ICD-10-CM

## 2021-07-02 ENCOUNTER — Ambulatory Visit: Payer: 59 | Admitting: Podiatry

## 2021-07-02 DIAGNOSIS — M21862 Other specified acquired deformities of left lower leg: Secondary | ICD-10-CM | POA: Diagnosis not present

## 2021-07-02 DIAGNOSIS — M722 Plantar fascial fibromatosis: Secondary | ICD-10-CM

## 2021-07-02 NOTE — Patient Instructions (Signed)
[facility]  [address] [place], [place] [address] [phone]

## 2021-07-02 NOTE — Progress Notes (Signed)
  Subjective:  Patient ID: Deborah Velasquez, female    DOB: October 29, 1960,  MRN: [id number]  Chief Complaint  Patient presents with   Plantar Fasciitis    MRI results    61 y.o. female presents with the above complaint. History confirmed with patient.  Pain has improved quite a bit after the last injection  Objective:  Physical Exam: warm, good capillary refill, no trophic changes or ulcerative lesions, normal DP and PT pulses, and normal sensory exam. Left Foot: Minimal tenderness today   No images are attached to the encounter.  Radiographs: Multiple views x-ray of the left foot: no fracture, dislocation, swelling or degenerative changes noted and plantar calcaneal spur which is increased in size since last visit Assessment:   No diagnosis found.    Plan:  Patient was evaluated and treated and all questions answered.  We reviewed the results of the MRI.  There is certainly plantar fasciitis on the medial band on the MRI with edema at the insertion.  No evidence of tearing.  She has not started physical therapy and we will confirm if they have received the referral for this but would like to start this ASAP.  We also discussed operative treatment if it does not improve.  I will see her back in 6 weeks or sooner if it does not improve, she would like to schedule surgery.  Return in about 6 weeks (around 08/13/2021) for recheck plantar fasciitis.

## 2021-07-16 DIAGNOSIS — M6281 Muscle weakness (generalized): Secondary | ICD-10-CM | POA: Diagnosis not present

## 2021-07-16 DIAGNOSIS — M25572 Pain in left ankle and joints of left foot: Secondary | ICD-10-CM | POA: Diagnosis not present

## 2021-07-16 DIAGNOSIS — M25675 Stiffness of left foot, not elsewhere classified: Secondary | ICD-10-CM | POA: Diagnosis not present

## 2021-07-30 DIAGNOSIS — M25572 Pain in left ankle and joints of left foot: Secondary | ICD-10-CM | POA: Diagnosis not present

## 2021-07-30 DIAGNOSIS — M6281 Muscle weakness (generalized): Secondary | ICD-10-CM | POA: Diagnosis not present

## 2021-07-30 DIAGNOSIS — M25675 Stiffness of left foot, not elsewhere classified: Secondary | ICD-10-CM | POA: Diagnosis not present

## 2021-08-06 DIAGNOSIS — M6281 Muscle weakness (generalized): Secondary | ICD-10-CM | POA: Diagnosis not present

## 2021-08-06 DIAGNOSIS — M25675 Stiffness of left foot, not elsewhere classified: Secondary | ICD-10-CM | POA: Diagnosis not present

## 2021-08-06 DIAGNOSIS — M25572 Pain in left ankle and joints of left foot: Secondary | ICD-10-CM | POA: Diagnosis not present

## 2021-08-13 ENCOUNTER — Ambulatory Visit: Payer: 59 | Admitting: Podiatry

## 2021-08-13 DIAGNOSIS — M25572 Pain in left ankle and joints of left foot: Secondary | ICD-10-CM | POA: Diagnosis not present

## 2021-08-13 DIAGNOSIS — M6281 Muscle weakness (generalized): Secondary | ICD-10-CM | POA: Diagnosis not present

## 2021-08-13 DIAGNOSIS — M25675 Stiffness of left foot, not elsewhere classified: Secondary | ICD-10-CM | POA: Diagnosis not present

## 2021-09-24 DIAGNOSIS — R69 Illness, unspecified: Secondary | ICD-10-CM | POA: Diagnosis not present

## 2021-09-24 DIAGNOSIS — I10 Essential (primary) hypertension: Secondary | ICD-10-CM | POA: Diagnosis not present

## 2021-09-24 DIAGNOSIS — F5104 Psychophysiologic insomnia: Secondary | ICD-10-CM | POA: Diagnosis not present

## 2021-09-24 DIAGNOSIS — E119 Type 2 diabetes mellitus without complications: Secondary | ICD-10-CM | POA: Diagnosis not present

## 2021-12-08 DIAGNOSIS — Z1331 Encounter for screening for depression: Secondary | ICD-10-CM | POA: Diagnosis not present

## 2021-12-08 DIAGNOSIS — E119 Type 2 diabetes mellitus without complications: Secondary | ICD-10-CM | POA: Diagnosis not present

## 2021-12-08 DIAGNOSIS — Z0131 Encounter for examination of blood pressure with abnormal findings: Secondary | ICD-10-CM | POA: Diagnosis not present

## 2021-12-08 DIAGNOSIS — I10 Essential (primary) hypertension: Secondary | ICD-10-CM | POA: Diagnosis not present

## 2021-12-08 DIAGNOSIS — Z1389 Encounter for screening for other disorder: Secondary | ICD-10-CM | POA: Diagnosis not present

## 2021-12-08 DIAGNOSIS — Z0000 Encounter for general adult medical examination without abnormal findings: Secondary | ICD-10-CM | POA: Diagnosis not present

## 2021-12-08 DIAGNOSIS — Z712 Person consulting for explanation of examination or test findings: Secondary | ICD-10-CM | POA: Diagnosis not present

## 2021-12-09 ENCOUNTER — Other Ambulatory Visit: Payer: Self-pay | Admitting: Family Medicine

## 2021-12-09 DIAGNOSIS — Z1231 Encounter for screening mammogram for malignant neoplasm of breast: Secondary | ICD-10-CM

## 2022-06-26 DIAGNOSIS — E119 Type 2 diabetes mellitus without complications: Secondary | ICD-10-CM | POA: Diagnosis not present

## 2022-08-03 DIAGNOSIS — Z0131 Encounter for examination of blood pressure with abnormal findings: Secondary | ICD-10-CM | POA: Diagnosis not present

## 2022-08-03 DIAGNOSIS — Z1389 Encounter for screening for other disorder: Secondary | ICD-10-CM | POA: Diagnosis not present

## 2022-08-03 DIAGNOSIS — E119 Type 2 diabetes mellitus without complications: Secondary | ICD-10-CM | POA: Diagnosis not present

## 2022-08-03 DIAGNOSIS — Z0130 Encounter for examination of blood pressure without abnormal findings: Secondary | ICD-10-CM | POA: Diagnosis not present

## 2022-08-03 DIAGNOSIS — Z712 Person consulting for explanation of examination or test findings: Secondary | ICD-10-CM | POA: Diagnosis not present

## 2022-08-03 DIAGNOSIS — K219 Gastro-esophageal reflux disease without esophagitis: Secondary | ICD-10-CM | POA: Diagnosis not present

## 2022-08-03 DIAGNOSIS — I10 Essential (primary) hypertension: Secondary | ICD-10-CM | POA: Diagnosis not present

## 2022-08-03 DIAGNOSIS — E785 Hyperlipidemia, unspecified: Secondary | ICD-10-CM | POA: Diagnosis not present

## 2022-08-07 ENCOUNTER — Other Ambulatory Visit: Payer: Self-pay | Admitting: Family Medicine

## 2022-08-07 DIAGNOSIS — Z1231 Encounter for screening mammogram for malignant neoplasm of breast: Secondary | ICD-10-CM

## 2022-09-01 DIAGNOSIS — B589 Toxoplasmosis, unspecified: Secondary | ICD-10-CM | POA: Diagnosis not present

## 2022-09-01 DIAGNOSIS — D3132 Benign neoplasm of left choroid: Secondary | ICD-10-CM | POA: Diagnosis not present

## 2022-09-01 DIAGNOSIS — I10 Essential (primary) hypertension: Secondary | ICD-10-CM | POA: Diagnosis not present

## 2022-09-01 DIAGNOSIS — E119 Type 2 diabetes mellitus without complications: Secondary | ICD-10-CM | POA: Diagnosis not present

## 2023-01-04 ENCOUNTER — Encounter: Payer: Self-pay | Admitting: Family Medicine

## 2023-01-04 ENCOUNTER — Other Ambulatory Visit: Payer: Self-pay | Admitting: Family Medicine

## 2023-01-04 DIAGNOSIS — K862 Cyst of pancreas: Secondary | ICD-10-CM

## 2023-01-04 DIAGNOSIS — R1033 Periumbilical pain: Secondary | ICD-10-CM

## 2023-01-11 ENCOUNTER — Ambulatory Visit
Admission: RE | Admit: 2023-01-11 | Discharge: 2023-01-11 | Disposition: A | Discharge status: Home / Self Care | Payer: 59 | Source: Ambulatory Visit | Attending: Family Medicine | Admitting: Family Medicine

## 2023-01-11 DIAGNOSIS — R1033 Periumbilical pain: Secondary | ICD-10-CM

## 2023-01-11 DIAGNOSIS — K862 Cyst of pancreas: Secondary | ICD-10-CM

## 2023-05-24 ENCOUNTER — Other Ambulatory Visit: Payer: Self-pay | Admitting: Family Medicine

## 2023-05-24 DIAGNOSIS — Z1231 Encounter for screening mammogram for malignant neoplasm of breast: Secondary | ICD-10-CM
